# Patient Record
Sex: Male | Born: 1995 | Race: Black or African American | Hispanic: No | Marital: Single | Smoking: Never smoker
Health system: Southern US, Community
[De-identification: ages and names within clinical notes are randomized; demographics above are authoritative.]

## PROBLEM LIST (undated history)

## (undated) DIAGNOSIS — T7840XA Allergy, unspecified, initial encounter: Secondary | ICD-10-CM

## (undated) DIAGNOSIS — F988 Other specified behavioral and emotional disorders with onset usually occurring in childhood and adolescence: Secondary | ICD-10-CM

## (undated) DIAGNOSIS — R278 Other lack of coordination: Secondary | ICD-10-CM

## (undated) DIAGNOSIS — J45909 Unspecified asthma, uncomplicated: Secondary | ICD-10-CM

## (undated) DIAGNOSIS — F902 Attention-deficit hyperactivity disorder, combined type: Secondary | ICD-10-CM

## (undated) HISTORY — DX: Unspecified asthma, uncomplicated: J45.909

## (undated) HISTORY — DX: Other specified behavioral and emotional disorders with onset usually occurring in childhood and adolescence: F98.8

## (undated) HISTORY — PX: TYMPANOSTOMY TUBE PLACEMENT: SHX32

## (undated) HISTORY — DX: Attention-deficit hyperactivity disorder, combined type: F90.2

## (undated) HISTORY — DX: Other lack of coordination: R27.8

## (undated) HISTORY — DX: Allergy, unspecified, initial encounter: T78.40XA

---
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
## 2017-01-09 ENCOUNTER — Telehealth: Payer: Self-pay | Admitting: Pediatrics

## 2017-01-09 ENCOUNTER — Institutional Professional Consult (permissible substitution): Payer: Self-pay | Admitting: Pediatrics

## 2017-01-09 NOTE — Telephone Encounter (Addendum)
Patient call and canceled sick .Rescheulde the appointment for February 06 2017 at 9am.

## 2017-02-06 ENCOUNTER — Encounter: Payer: Self-pay | Admitting: Pediatrics

## 2017-02-06 ENCOUNTER — Ambulatory Visit (INDEPENDENT_AMBULATORY_CARE_PROVIDER_SITE_OTHER): Payer: Medicaid Other | Admitting: Pediatrics

## 2017-02-06 VITALS — BP 130/75 | HR 68 | Ht 68.5 in | Wt 143.0 lb

## 2017-02-06 DIAGNOSIS — R278 Other lack of coordination: Secondary | ICD-10-CM | POA: Diagnosis not present

## 2017-02-06 DIAGNOSIS — F902 Attention-deficit hyperactivity disorder, combined type: Secondary | ICD-10-CM | POA: Diagnosis not present

## 2017-02-06 NOTE — Progress Notes (Signed)
[facility] [address] [place]. [address] [place] [address] Dept: [phone] Dept Fax: [phone] Loc: [phone] Loc Fax: [phone]  Medical Follow-up  Patient ID: Ricky Parker, male  DOB: Aug 04, 1996, 21 y.o.  MRN: [id number]  Date of Evaluation: 09/18/16   PCP: Fredderick Severance, MD  Accompanied by: [facility] Patient Lives with: grandparents  HISTORY/CURRENT STATUS:  Polite and cooperative and present for three month follow up for routine medication management of ADHD. From previous note: MGM and M very concerned with transition/adjustment issues to [facility]       EDUCATION: School: [employer] Year/Grade:Freshman Theories of Motivation, Eng101, FY Exp, FY Sem, Jazz Band Trumpet lessons 17 credits currently Classes stared back Jan 2018 On Spring break right now.  Has currently 2 credits completed. Will plan on summer sessions 2018 and 2019  Performance/Grades: average Services: IEP/504 Plan has DSO documentation sent this summer.  Has a Proofreader that he did use for a paper  Activities/Exercise: [employer] intramural - season over. Goes to gym daily works out and plays trumpet  Lives alone, now has a single.  Suite style with 3 other guys, don't know them that well. No kitchen, suite has one bathroom for 4 people. All Freshman  Has girlfriend - [name], sophomore. Major in psych want social worker.  Met through [employer], she is Investment banker, corporate.  Still meal plan but reduced cost, dislikes food.  [employer] on Friday, Saturday, Sundays, Monday and Tuesday Planning on living with GF next year.  MEDICAL HISTORY: Appetite: WNL  Sleep: Bedtime: 2200 - 2300 Awakens: 1000 Sleep Concerns: Initiation/Maintenance/Other: Asleep easily, sleeps through the night, feels well-rested.  No Sleep concerns. Asleep easily, sleeps through the  night, feels well-rested.  No Sleep concerns. No concerns for toileting. Daily stool, no constipation or diarrhea. Void urine no difficulty. No enuresis.   Participate in daily oral hygiene to include brushing and flossing.  Individual Medical History/Review of System Changes? Yes Allergy currently had flu shot  Allergies: Pollen extract and Peanut-containing drug products  Current Medications:  No ADHD meds at present. Parents wanted meds again, patient did not take Medication Side Effects: None    Family Medical/Social History Changes?: No  MENTAL HEALTH: Mental Health Issues:  Denies sadness, loneliness or depression. No self harm or thoughts of self harm or injury. Denies fears, worries and anxieties. Has good peer relations and is not a bully nor is victimized.   PHYSICAL EXAM: Vitals:  Today's Vitals   02/06/17 0913  BP: 130/75  Pulse: 68  Weight: 143 lb (64.9 kg)  Height: 5' 8.5" (1.74 m)  , 35 %ile (Z= -0.38) based on CDC 2-20 Years BMI-for-age data using vitals from 02/06/2017. Body mass index is 21.43 kg/m.  Review of Systems  HENT: Positive for congestion.   Psychiatric/Behavioral: Negative for depression. The patient is not nervous/anxious.   All other systems reviewed and are negative.   General Exam: Physical Exam  Constitutional: He is oriented to person, place, and time. Vital signs are normal. He appears well-developed and well-nourished. He is cooperative. No distress.  HENT:  Head: Normocephalic.  Right Ear: Tympanic membrane and ear canal normal.  Left Ear: Tympanic membrane and ear canal normal.  Nose: Nose normal.  Mouth/Throat: Uvula is midline, oropharynx is clear and moist and mucous membranes are normal.  Eyes: Conjunctivae, EOM and lids are normal. Pupils are equal, round, and reactive to light.  Neck: Normal range of motion. Neck supple. No thyromegaly present.  Cardiovascular: Normal rate, regular rhythm and intact distal pulses.     Pulmonary/Chest: Effort normal and breath sounds normal.  Abdominal: Soft. Normal appearance.  Genitourinary:  Genitourinary Comments: Deferred  Musculoskeletal: Normal range of motion.  Neurological: He is alert and oriented to person, place, and time. He has normal strength and normal reflexes. He displays no tremor. No cranial nerve deficit or sensory deficit. He exhibits normal muscle tone. He displays a negative Romberg sign. He displays no seizure activity. Coordination and gait normal.  Skin: Skin is warm, dry and intact.  Psychiatric: He has a normal mood and affect. His speech is normal and behavior is normal. Judgment and thought content normal. His mood appears not anxious. His affect is not inappropriate. He is not agitated, not aggressive and not hyperactive. Cognition and memory are normal. He does not express impulsivity or inappropriate judgment. He expresses no suicidal ideation. He expresses no suicidal plans. He is attentive.  Vitals reviewed.   Neurological: oriented to time, place, and person Cranial Nerves: normal  Neuromuscular:  Motor Mass: Normal Tone: Average  Strength: Good DTRs: 2+ and symmetric Overflow: None Reflexes: no tremors noted, finger to nose without dysmetria bilaterally, performs thumb to finger exercise without difficulty, no palmar drift, gait was normal, tandem gait was normal and no ataxic movements noted Sensory Exam: Vibratory: WNL  Fine Touch: WNL  Testing/Developmental Screens: ASRS = 0      DISCUSSION:  Reviewed old records and/or current chart. Reviewed growth and development with anticipatory guidance provided. Discussed using DOS at college and adjusting time and time management to focus on academics and lessen other distractions. Continues to work longer hours to get money due to family decreasing support.  He decided to get a single room, they expect him to pay the room/board difference.  Reviewed school progress and  accommodations. Discussed emancipation and transition to college. Reviewed medication administration, effects, and possible side effects.  ADHD medications discussed to include different medications and pharmacologic properties of each. Recommendation for specific medication to include dose, administration, expected effects, possible side effects and the risk to benefit ratio of medication management. No medications at present.  Reviewed importance of good sleep hygiene, limited screen time, regular exercise and healthy eating.   DIAGNOSES:    ICD-9-CM ICD-10-CM   1. ADHD (attention deficit hyperactivity disorder), combined type 314.01 F90.2   2. Dysgraphia 781.3 R27.8     RECOMMENDATIONS:   Patient Instructions  No medication at present. Follow up will be at patient's discretion and as needed.   ADHD support groups in [place] as discussed. [email or web address].fr  ADDitude Magazine:  HolyTattoo.de    Patient verbalized understanding of all topics discussed.  NEXT APPOINTMENT: Return if symptoms worsen or fail to improve, for Medical Follow up. Medical Decision-making: More than 50% of the appointment was spent counseling and discussing diagnosis and management of symptoms with the patient and family.  Len Childs, NP Counseling Time: 40 Total Contact Time: 50

## 2017-02-06 NOTE — Patient Instructions (Signed)
No medication at present. Follow up will be at patient's discretion and as needed.   ADHD support groups in [place] as discussed. [email or web address]  ADDitude Magazine:  [email or web address]://www.additudemag.com/

## 2017-03-11 ENCOUNTER — Emergency Department (HOSPITAL_BASED_OUTPATIENT_CLINIC_OR_DEPARTMENT_OTHER): Payer: Medicaid Other

## 2017-03-11 ENCOUNTER — Emergency Department (HOSPITAL_BASED_OUTPATIENT_CLINIC_OR_DEPARTMENT_OTHER)
Admission: EM | Admit: 2017-03-11 | Discharge: 2017-03-11 | Disposition: A | Discharge status: Home / Self Care | Payer: Medicaid Other | Attending: Emergency Medicine | Admitting: Emergency Medicine

## 2017-03-11 ENCOUNTER — Encounter (HOSPITAL_BASED_OUTPATIENT_CLINIC_OR_DEPARTMENT_OTHER): Payer: Self-pay | Admitting: *Deleted

## 2017-03-11 DIAGNOSIS — Y999 Unspecified external cause status: Secondary | ICD-10-CM | POA: Diagnosis not present

## 2017-03-11 DIAGNOSIS — W01190A Fall on same level from slipping, tripping and stumbling with subsequent striking against furniture, initial encounter: Secondary | ICD-10-CM | POA: Diagnosis not present

## 2017-03-11 DIAGNOSIS — Y939 Activity, unspecified: Secondary | ICD-10-CM | POA: Diagnosis not present

## 2017-03-11 DIAGNOSIS — Y9289 Other specified places as the place of occurrence of the external cause: Secondary | ICD-10-CM | POA: Diagnosis not present

## 2017-03-11 DIAGNOSIS — S6991XA Unspecified injury of right wrist, hand and finger(s), initial encounter: Secondary | ICD-10-CM | POA: Diagnosis present

## 2017-03-11 DIAGNOSIS — J45909 Unspecified asthma, uncomplicated: Secondary | ICD-10-CM | POA: Insufficient documentation

## 2017-03-11 DIAGNOSIS — F909 Attention-deficit hyperactivity disorder, unspecified type: Secondary | ICD-10-CM | POA: Insufficient documentation

## 2017-03-11 DIAGNOSIS — S62306A Unspecified fracture of fifth metacarpal bone, right hand, initial encounter for closed fracture: Secondary | ICD-10-CM | POA: Diagnosis not present

## 2017-03-11 DIAGNOSIS — T1490XA Injury, unspecified, initial encounter: Secondary | ICD-10-CM

## 2017-03-11 DIAGNOSIS — Z79899 Other long term (current) drug therapy: Secondary | ICD-10-CM | POA: Diagnosis not present

## 2017-03-11 MED ORDER — HYDROCODONE-ACETAMINOPHEN 5-325 MG PO TABS: 1.0000 | ORAL_TABLET | Freq: Four times a day (QID) | ORAL | 0 refills | Status: AC | PRN

## 2017-03-11 MED ORDER — IBUPROFEN 800 MG PO TABS
800.0000 mg | ORAL_TABLET | Freq: Once | ORAL | Status: AC
  Administered 2017-03-11: 800 mg via ORAL
  Filled 2017-03-11: qty 1

## 2017-03-11 MED ORDER — HYDROCODONE-ACETAMINOPHEN 5-325 MG PO TABS
2.0000 | ORAL_TABLET | Freq: Once | ORAL | Status: DC
  Filled 2017-03-11: qty 2

## 2017-03-11 NOTE — ED Notes (Signed)
To x-ray

## 2017-03-11 NOTE — ED Triage Notes (Signed)
Pt states that approx 30 min pta he tripped and fell into a desk in his dorm room. Concerned because he fx this same right hand in Jan. Swelling noted to right hand on exam. No other injury

## 2017-03-11 NOTE — ED Notes (Signed)
Pt states he was getting out of bed to use the restroom and his feet got caught up in the sheets. He was caught off balance and tried to catch himself, injuring his right hand. Swelling noted over medial posterior aspect of right hand. Moves fingers. Feels touch. Cap refill < 3 sec. Ice being applied.

## 2017-03-11 NOTE — ED Notes (Signed)
Ice pack given to pt.

## 2017-03-11 NOTE — ED Notes (Signed)
Pt states he would prefer ibuprofen at d/c instead of vicodin.

## 2017-03-11 NOTE — Discharge Instructions (Signed)
Wear splint as applied until followed up by orthopedics.  Ice for 20 minutes every 2 hours while awake for the next 2 days.  Hydrocodone as prescribed as needed for pain.  Follow-up with orthopedic surgery in the next 2-3 days. The contact information for Dr. Mina Marble has been provided in this discharge summary for you to call and make these arrangements.

## 2017-03-11 NOTE — ED Notes (Signed)
Splint instructions given. Voiced understanding.

## 2017-03-11 NOTE — ED Provider Notes (Signed)
[facility] Note   CSN: [id number] Arrival date & time: 03/11/17  0127     History   Chief Complaint Chief Complaint  Patient presents with  . hand injury    HPI Ricky Parker is a 21 y.o. male.  Patient is a 21 year old male with history of ADHD. He presents for evaluation of right hand pain. He reports falling in his dorm room and striking his hand on a bedside table. He recently had a boxer's fracture from punching the wall and is now painful and swollen in this area again. He denies any numbness or tingling. He denies any other injury. Pain is worse with movement and palpation. Relieved with rest, ice.   The history is provided by the patient.    Past Medical History:  Diagnosis Date  . ADD (attention deficit disorder)   . ADHD (attention deficit hyperactivity disorder), combined type 02/22/2016  . Allergy   . Asthma   . Dysgraphia 02/22/2016    Patient Active Problem List   Diagnosis Date Noted  . ADHD (attention deficit hyperactivity disorder), combined type 02/22/2016  . Dysgraphia 02/22/2016  . Sports physical 05/26/2013    Past Surgical History:  Procedure Laterality Date  . Broken wrist  December 2016  . TYMPANOSTOMY TUBE PLACEMENT         Home Medications    Prior to Admission medications   Medication Sig Start Date End Date Taking? Authorizing Provider  levocetirizine (XYZAL) 5 MG tablet Take 5 mg by mouth every evening.    Historical Provider, MD  montelukast (SINGULAIR) 10 MG tablet Take 10 mg by mouth every evening. 02/29/16   Historical Provider, MD  QVAR 80 MCG/ACT inhaler Inhale 2 puffs into the lungs 2 (two) times daily. Reported on 02/22/2016 02/02/16   Historical Provider, MD    Family History Family History  Problem Relation Age of Onset  . ADD / ADHD Mother   . Allergies Mother   . Learning disabilities Mother   . ADD / ADHD Father   . Drug abuse Father   . Depression Maternal Grandmother   . Fibromyalgia  Maternal Grandmother   . Sudden death Neg Hx   . Heart attack Neg Hx     Social History Social History  Substance Use Topics  . Smoking status: Never Smoker  . Smokeless tobacco: Never Used  . Alcohol use No     Allergies   Pollen extract and Peanut-containing drug products   Review of Systems Review of Systems  All other systems reviewed and are negative.    Physical Exam Updated Vital Signs BP 124/64   Pulse 63   Temp 98.2 F (36.8 C)   Resp 18   Ht  (1.727 m)   Wt 145 lb (65.8 kg)   SpO2 100%   BMI 22.05 kg/m   Physical Exam  Constitutional: He is oriented to person, place, and time. He appears well-developed and well-nourished. No distress.  HENT:  Head: Normocephalic and atraumatic.  Neck: Normal range of motion. Neck supple.  Musculoskeletal:  There is swelling over the distal fifth metacarpal. He has good range of motion. Distal cap refill is brisk and sensation and motor are intact to the fifth finger.  Neurological: He is alert and oriented to person, place, and time.  Skin: Skin is warm and dry. He is not diaphoretic.  Nursing note and vitals reviewed.    ED Treatments / Results  Labs (all labs ordered are listed, but  only abnormal results are displayed) Labs Reviewed - No data to display  EKG  EKG Interpretation None       Radiology Dg Hand Complete Right  Result Date: 03/11/2017 CLINICAL DATA:  Larey Seat off dorm bed 45 minutes ago and hit right hand on desk, with right fifth metacarpal pain and swelling. Prior right fifth metacarpal fracture in December 2017. Initial encounter. EXAM: RIGHT HAND - COMPLETE 3+ VIEW COMPARISON:  Right hand radiographs performed 11/30/2016 FINDINGS: There appears to be a new minimally comminuted horizontal fracture at the site of prior healed fracture at the distal fifth metacarpal. There is underlying chronic deformity of the fifth metacarpal. Overlying soft tissue swelling is noted. No additional fractures  are seen. Visualized joint spaces are preserved. The carpal rows appear grossly intact, and demonstrate normal alignment. IMPRESSION: New minimally comminuted horizontal fracture at the site of prior healed fracture at the distal fifth metacarpal. Underlying chronic deformity of the fifth metacarpal. Electronically Signed   By: Roanna Raider M.D.   On: 03/11/2017 02:13    Procedures Procedures (including critical care time)  Medications Ordered in ED Medications - No data to display   Initial Impression / Assessment and Plan / ED Course  I have reviewed the triage vital signs and the nursing notes.  Pertinent labs & imaging results that were available during my care of the patient were reviewed by me and considered in my medical decision making (see chart for details).  X-ray show a recurrent fracture of the distal fifth metacarpal. He will be placed in an ulnar gutter splint and advised to follow-up with hand surgery.  Final Clinical Impressions(s) / ED Diagnoses   Final diagnoses:  Injury    New Prescriptions New Prescriptions   No medications on file     Geoffery Lyons, MD 03/11/17 [phone]

## 2017-03-12 ENCOUNTER — Encounter (INDEPENDENT_AMBULATORY_CARE_PROVIDER_SITE_OTHER): Payer: Self-pay | Admitting: Orthopaedic Surgery

## 2017-03-12 ENCOUNTER — Ambulatory Visit (INDEPENDENT_AMBULATORY_CARE_PROVIDER_SITE_OTHER): Payer: Medicaid Other | Admitting: Orthopaedic Surgery

## 2017-03-12 ENCOUNTER — Ambulatory Visit (INDEPENDENT_AMBULATORY_CARE_PROVIDER_SITE_OTHER): Payer: Medicaid Other

## 2017-03-12 DIAGNOSIS — S62339A Displaced fracture of neck of unspecified metacarpal bone, initial encounter for closed fracture: Secondary | ICD-10-CM | POA: Insufficient documentation

## 2017-03-12 NOTE — Progress Notes (Signed)
Office Visit Note   Patient: Ricky Parker           Date of Birth: 03/03/1996           MRN: [id number] Visit Date: 03/12/2017              Requested by: Santa Genera, MD [address] [place], [place] [address] PCP: Fredderick Severance, MD   Assessment & Plan: Visit Diagnoses:  1. Boxer's fracture, closed, initial encounter     Plan:  Lateral x-ray of the hand shows volar angulation of approximately 60. My recommendation was for closed reduction and percutaneous pinning. I did discuss closed treatment with manipulation of the fracture this is less reliable maintaining the reduction. The patient does refuse casting which makes closed treatment less than reliable. He does not want to undergo surgery. I did perform a manipulation of his fracture in hopes of getting into better alignment it was then placed in a short wrist brace and buddy tape since the patient refuses a cast or splint. Patient is moving to [place] in about 2 weeks. I did tell him that he needs to follow-up with orthopedic surgeon when he gets out there. He understands the possibility of long-term functional deficits related to a malunion of this fracture.  Follow-Up Instructions: Return if symptoms worsen or fail to improve.   Orders:  Orders Placed This Encounter  Procedures  . XR Hand Complete Right   No orders of the defined types were placed in this encounter.     Procedures: No procedures performed   Clinical Data: No additional findings.   Subjective: Chief Complaint  Patient presents with  . Right Hand - Pain, Fracture    Patient is a 21 year old right-hand dominant gentleman who sustained a fracture of his right fifth metacarpal. He originally sustained a boxer's fracture in December which was treated nonoperatively. This time he fell out of his bed in his dorm room and landed directly on his hand. Pain is 3-5 out of 10. He is not taking any pain killers. He has been in a ulnar gutter  splint.    Review of Systems  Constitutional: Negative.   All other systems reviewed and are negative.    Objective: Vital Signs: There were no vitals taken for this visit.  Physical Exam  Constitutional: He is oriented to person, place, and time. He appears well-developed and well-nourished.  HENT:  Head: Normocephalic and atraumatic.  Eyes: Pupils are equal, round, and reactive to light.  Neck: Neck supple.  Pulmonary/Chest: Effort normal.  Abdominal: Soft.  Musculoskeletal: Normal range of motion.  Neurological: He is alert and oriented to person, place, and time.  Skin: Skin is warm.  Psychiatric: He has a normal mood and affect. His behavior is normal. Judgment and thought content normal.  Nursing note and vitals reviewed.   Ortho Exam Right hand exam shows no rotational deformity of the small finger. He has normal flexion and extension of the MCP joint. The finger is warm well-perfused. Specialty Comments:  No specialty comments available.  Imaging: No results found.   PMFS History: Patient Active Problem List   Diagnosis Date Noted  . Boxer's fracture, closed, initial encounter 03/12/2017  . ADHD (attention deficit hyperactivity disorder), combined type 02/22/2016  . Dysgraphia 02/22/2016  . Sports physical 05/26/2013   Past Medical History:  Diagnosis Date  . ADD (attention deficit disorder)   . ADHD (attention deficit hyperactivity disorder), combined type 02/22/2016  . Allergy   . Asthma   .  Dysgraphia 02/22/2016    Family History  Problem Relation Age of Onset  . ADD / ADHD Mother   . Allergies Mother   . Learning disabilities Mother   . ADD / ADHD Father   . Drug abuse Father   . Depression Maternal Grandmother   . Fibromyalgia Maternal Grandmother   . Sudden death Neg Hx   . Heart attack Neg Hx     Past Surgical History:  Procedure Laterality Date  . Broken wrist  December 2016  . TYMPANOSTOMY TUBE PLACEMENT     Social History    Occupational History  . Not on file.   Social History Main Topics  . Smoking status: Never Smoker  . Smokeless tobacco: Never Used  . Alcohol use No  . Drug use: No  . Sexual activity: Yes    Partners: Female    Birth control/ protection: Condom

## 2017-03-13 ENCOUNTER — Inpatient Hospital Stay (INDEPENDENT_AMBULATORY_CARE_PROVIDER_SITE_OTHER): Payer: Medicaid Other | Admitting: Orthopedic Surgery
